# Patient Record
Sex: Female | Born: 1990 | Race: White | Hispanic: No | Marital: Single | Smoking: Never smoker
Health system: Southern US, Community
[De-identification: ages and names within clinical notes are randomized; demographics above are authoritative.]

## PROBLEM LIST (undated history)

## (undated) DIAGNOSIS — G253 Myoclonus: Secondary | ICD-10-CM

## (undated) DIAGNOSIS — G808 Other cerebral palsy: Secondary | ICD-10-CM

## (undated) DIAGNOSIS — R569 Unspecified convulsions: Secondary | ICD-10-CM

## (undated) HISTORY — DX: Other cerebral palsy: G80.8

## (undated) HISTORY — DX: Unspecified convulsions: R56.9

## (undated) HISTORY — DX: Myoclonus: G25.3

---
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
## 2012-01-27 ENCOUNTER — Emergency Department: Payer: Self-pay | Admitting: Emergency Medicine

## 2013-01-11 DIAGNOSIS — G253 Myoclonus: Secondary | ICD-10-CM | POA: Insufficient documentation

## 2013-01-11 DIAGNOSIS — G808 Other cerebral palsy: Secondary | ICD-10-CM | POA: Insufficient documentation

## 2013-02-05 ENCOUNTER — Ambulatory Visit (INDEPENDENT_AMBULATORY_CARE_PROVIDER_SITE_OTHER): Payer: 59 | Admitting: Pediatrics

## 2013-02-05 ENCOUNTER — Encounter: Payer: Self-pay | Admitting: Pediatrics

## 2013-02-05 VITALS — BP 106/76 | HR 72 | Ht 59.25 in | Wt 129.4 lb

## 2013-02-05 DIAGNOSIS — Z3041 Encounter for surveillance of contraceptive pills: Secondary | ICD-10-CM

## 2013-02-05 DIAGNOSIS — G808 Other cerebral palsy: Secondary | ICD-10-CM

## 2013-02-05 DIAGNOSIS — G253 Myoclonus: Secondary | ICD-10-CM

## 2013-02-05 MED ORDER — MINASTRIN 24 FE 1-20 MG-MCG(24) PO CHEW: CHEWABLE_TABLET | ORAL | Status: AC

## 2013-02-05 MED ORDER — KEPPRA XR 750 MG PO TB24: ORAL_TABLET | ORAL | Status: DC

## 2013-02-05 NOTE — Progress Notes (Signed)
Patient: Crystal Gonzales MRN: [id number] Sex: female DOB: 02/10/1990  Provider: Deetta PerlaHICKLING,Kimela Malstrom H, MD Location of Care: [facility]  Note type: Routine return visit  History of Present Illness: Referral Source: Dr. Johnsie Cancelhomas Schemerhorn History from: mother, patient and [facility] chart Chief Complaint: Seizures  Crystal Gonzales is a 23 y.o. female who returns for evaluation and management of congenital right hemiparesis, and photosensitive myoclonus.  The patient was seen on February 05, 2013.  Her last visit was on February 03, 2012.  She has a history of photosensitive myoclonus.  She had congenital left brain infarction, which caused right hemiparesis and hemiatrophy.  Her last known seizure was on August 12, 2011.  This was associated with failure to take her medicine the night before.  She has not experienced any further seizures or myoclonus.  She was able to obtain her driver's license.  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  She plans to graduate this spring with a degree in business administration and has decided to come back to [place] to seek employment.    She takes and tolerates Keppra XR without significant side effects.  She has a longstanding problem with insomnia and states that it may take her 2 to 3 hours to fall asleep, but she gives herself 10 hours to sleep.  She has a problem with obesity and easy fatigability.  Despite the fact that Keppra increases her appetite, she has only gained 3 pounds in the past year.  She has not had any significant underlying medical problems.  She had no other concerns today.  Review of Systems: 12 system review was remarkable for weight gain, headache, insomnia and sleepiness  Past Medical History  Diagnosis Date  . Myoclonus   . Congenital hemiplegia   . Seizures    Hospitalizations: no, Head Injury: no, Nervous System Infections: no, Immunizations up to date: yes Past Medical  History Comments: Hospitalized briefly for pneumonia at age 79.  The patient presented at 23 years of age with a 1 year history of shaking all over lasting for seconds without loss of consciousness when in the presence of a flickering light.  His most often occur when she was in the car, particularly in the fall and winter and spring.  This also occurred when she pictures taken of her with a flash.  EEG July 11, 2006 showed photo myoclonic responses at 9, 11, and 13 Hz.  She had blinking movements of her eyelids at 5 and 7 Hz.  At 15 Hz the patient jerked her head up and turned away.  This stopped as soon as the stimulus stopped.  She was placed on Keppra which was gradually escalated and worked quite well.    She is also experienced migraine headaches and for a time kept a headache calendar.  Though she is gained considerable weight over the past few years, she was on Keppra for 2 years with a weight gain of 1 pound.  It seems unlikely that Keppra is responsible for her problem with weight.We have discussed alternatives to this treatment, but this is the best treatment for her condition, and has been effective.  Birth History 1 lbs. 14 oz. Infant born at 3926 weeks gestational age to a 23 year old g 2 p 0 0 1 0 female. Gestation was complicated by vaginal bleeding throughout the pregnancy, incompetent cervix, possible placenta previa. Mother received Epidural anesthesia normal spontaneous vaginal delivery Nursery Course was complicated by assist  ventilation for 8 weeks, grade 4 in her parenchymal hemorrhage Growth and Development was recalled as  delayed with evidence of right hemiparesis.  Diagnosis of delay was not made until she was 39 months of age no signs or present earlier.  Behavior History none  Surgical History Past Surgical History  Procedure Laterality Date  . Other surgical history  1995    abdominal herniorrhaphy surgery    Family History family history includes Liver disease  in her paternal grandfather. Family History is negative migraines, seizures, cognitive impairment, blindness, deafness, birth defects, chromosomal disorder, autism.  Social History History   Social History  . Marital Status: Single    Spouse Name: N/A    Number of Children: N/A  . Years of Education: N/A   Social History Main Topics  . Smoking status: Passive Smoke Exposure - Never Smoker  . Smokeless tobacco: Never Used  . Alcohol Use: Yes     Comment: Patient drinks alcohol once a month.  . Drug Use: No  . Sexual Activity: Yes    Partners: Male    Birth Control/ Protection: Pill   Other Topics Concern  . None   Social History Narrative  . 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 School comments Rilynne is doing well in school.  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 No current outpatient prescriptions on file prior to visit.   No current facility-administered medications on file prior to visit.   The medication list was reviewed and reconciled. All changes or newly prescribed medications were explained.  A complete medication list was provided to the patient/caregiver.  Allergies  Allergen Reactions  . Other     Cherries    Physical Exam BP 106/76  Pulse 72  Ht 4' 11.25" (1.505 m)  Wt 129 lb 6.4 oz (58.695 kg)  BMI 25.91 kg/m2  General: alert, well developed, well nourished, in no acute distress, brown hair, brown eyes, right handed Head: normocephalic, no dysmorphic features Ears, Nose and Throat: Otoscopic: Tympanic membranes normal.  Pharynx: oropharynx is pink without exudates or tonsillar hypertrophy. Neck: supple, full range of motion, no cranial or cervical bruits Respiratory: auscultation  clear Cardiovascular: no murmurs, pulses are normal Musculoskeletal: no skeletal deformities or apparent scoliosis Skin: no rashes or neurocutaneous lesions  Neurologic Exam  Mental Status: alert; oriented to person, place and year; knowledge is normal for age; language is normal Cranial Nerves: visual fields are full to double simultaneous stimuli; extraocular movements are full and conjugate; pupils are around reactive to light; funduscopic examination shows sharp disc margins with normal vessels; symmetric facial strength; midline tongue and uvula; air conduction is greater than bone conduction bilaterally. Motor: Normal strength, tone and mass; good fine motor movements; no pronator drift. Sensory: intact responses to cold, vibration, proprioception and stereognosis Coordination: good finger-to-nose, rapid repetitive alternating movements and finger apposition Gait and Station: normal gait and station: patient is able to walk on heels, toes and tandem without difficulty; balance is adequate; Romberg exam is negative; Gower response is negative Reflexes: symmetric and diminished bilaterally; no clonus; bilateral flexor plantar responses.  Assessment 1. Congenital hemiplegia (343.1). 2. Myoclonus in good control (333.2).  Discussion The patient is doing well.  There is no reason to change her medication.  Plan 1.Refill of Keppra  750 mg XR one twice daily.   2.At her request, I will also fill her Minestrin until she can return to see her obstetrician in May 2015.    I will continue to follow her until she is gainfully employed.  At that time, we will see her one last time and then refer her to adult neurology for ongoing followup.  I see no reason why she cannot continue to take this medication.  It has a low incidence of teratogenic effects, although the number of women pregnant on the medication is relatively small.  I will plan to see her when she returns home from school.  I spent 30  minutes of face-to-face time with the patient and her mother, more than half of it in consultation.  Deetta Perla MD

## 2013-08-10 ENCOUNTER — Other Ambulatory Visit: Payer: Self-pay | Admitting: Pediatrics

## 2013-12-07 ENCOUNTER — Telehealth: Payer: Self-pay

## 2013-12-07 NOTE — Telephone Encounter (Signed)
Crystal Gonzales lvm stating that she needs a refill and that she would like to schedule her f/u visit for January 2016. I called pt back and lvm asking her to call me back with regards to where she would like the refill sent. I told her that I would be happy to schedule the f/u as well. I will await the call back.

## 2013-12-07 NOTE — Telephone Encounter (Signed)
I called and left another vm asking her to call me with the below information.

## 2013-12-10 NOTE — Telephone Encounter (Signed)
I lvm for pt asking her to call me with the pharmacy information and to schedule a f/u visit as noted below.

## 2013-12-12 MED ORDER — KEPPRA XR 750 MG PO TB24: ORAL_TABLET | ORAL | Status: DC

## 2013-12-12 NOTE — Telephone Encounter (Signed)
Irving Burtonmily called and would like the Rx sent to [facility] and I scheduled f/u for 02/06/14.

## 2013-12-12 NOTE — Telephone Encounter (Signed)
I have faxed the Rx as requested.

## 2014-02-06 ENCOUNTER — Ambulatory Visit (INDEPENDENT_AMBULATORY_CARE_PROVIDER_SITE_OTHER): Payer: No Typology Code available for payment source | Admitting: Pediatrics

## 2014-02-06 ENCOUNTER — Encounter: Payer: Self-pay | Admitting: Pediatrics

## 2014-02-06 VITALS — BP 110/78 | HR 110 | Ht 59.25 in | Wt 141.6 lb

## 2014-02-06 DIAGNOSIS — G43009 Migraine without aura, not intractable, without status migrainosus: Secondary | ICD-10-CM | POA: Insufficient documentation

## 2014-02-06 DIAGNOSIS — G253 Myoclonus: Secondary | ICD-10-CM

## 2014-02-06 DIAGNOSIS — G808 Other cerebral palsy: Secondary | ICD-10-CM

## 2014-02-06 MED ORDER — KEPPRA XR 750 MG PO TB24: ORAL_TABLET | ORAL | Status: DC

## 2014-02-06 NOTE — Patient Instructions (Signed)
There are 3 lifestyle behaviors that are important to minimize headaches.  You should sleep 8 hours at night time.  Bedtime should be a set time for going to bed and waking up with few exceptions.  You need to drink about 48 ounces of water per day, more on days when you are out in the heat.  This works out to 3 16 ounce water bottles per day.  You may need to flavor the water so that you will be more likely to drink it.  Do not use Kool-Aid or other sugar drinks because they add empty calories and actually increase urine output.  You need to eat 3 meals per day.  You should not skip meals.  The meal does not have to be a big one.  Make daily entries into the headache calendar and sent it to me at the end of each calendar month.  I will call you or your parents and we will discuss the results of the headache calendar and make a decision about changing treatment if indicated.  You should receive 400 mg of Ibuprofen or 625 mg of acetaminophen at the onset of headaches that are severe enough to cause obvious pain and other symptoms.

## 2014-02-06 NOTE — Progress Notes (Signed)
Patient: Crystal Gonzales MRN: [id number] Sex: female DOB: [birth year]  Provider: Deetta Perla, MD Location of Care: [facility]  Note type: Routine return visit  History of Present Illness: Referral Source: Dr. Johnsie Cancel History from: patient and [facility] chart Chief Complaint: Seizures  Crystal Gonzales is a 24 y.o. female who was evaluated on February 06, 2014 for the first time since February 05, 2013.  She has congenital right hemiparesis and photosensitive myoclonus.  She had a congenital left brain infarction causing right hemiparesis, spasticity, and hemiatrophy.  Her last known seizure was on August 12, 2011, which was associated with failure to take medication the night before.  She takes and tolerates Keppra XR without significant side effects.  She has a longstanding problem with insomnia and knows that she has to give herself a longer period of time to sleep so that she is properly rested.  She has had a problem with obesity and unfortunately gained 12.6 pounds into the past year after controlling her weight in the previous year.  She has occasional migraine headaches which have worsened.  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  She works 12 hours shifts for two weeks on days with a three-day break and then two weeks on nights.  She has headaches almost every day that begin at 3 p.m. whether or not she is at work.  They involve the right frontal region and associated with pounding pain, nausea, and occasional vomiting.  Light irritates her right eye and movement causes increase pounding.  Headaches can last until the next day.  She takes over-the-counter acetaminophen with caffeine which provides some benefit.  She relates the increased frequency of her headaches to the stress in her job which is understandable.  She has  very good benefits and intends to keep this job.  She receives 90-day supplies of Keppra XR for her seizures through [facility].  She has her own insurance from her job and also saw her parents insurance which acts as a Ambulance person.  In the past year, she had problems with plantar fasciitis which fortunately has lessened considerably with the use of physical therapy.  Review of Systems: 12 system review was remarkable for headaches and nausea  Past Medical History Diagnosis Date  . Myoclonus   . Congenital hemiplegia   . Seizures    Hospitalizations: No., Head Injury: No., Nervous System Infections: No., Immunizations up to date: Yes.    Hospitalized briefly for pneumonia at age 81.  The patient presented at 34 years of age with a 1 year history of shaking all over lasting for seconds without loss of consciousness when in the presence of a flickering light. His most often occur when she was in the car, particularly in the fall and winter and spring. This also occurred when she pictures taken of her with a flash.  EEG July 11, 2006 showed photo myoclonic responses at 9, 11, and 13 Hz. She had blinking movements of her eyelids at 5 and 7 Hz. At 15 Hz the patient jerked her head up and turned away. This stopped as soon as the stimulus stopped.  She was placed on Keppra which was gradually escalated and worked quite well.   She is also experienced migraine headaches and for a time kept a headache calendar.  Though she has gained considerable weight over  the past few years, she was on Keppra for 2 years with a weight gain of 1 pound. It seems unlikely that Keppra is responsible for her problem with weight.We have discussed alternatives to this treatment, but this is the best treatment for her condition, and has been effective.  Birth History 1 lbs. 14 oz. Infant born at 426 weeks gestational age to a 24 year old g 2 p 0 0 1 0 female. Gestation was complicated by vaginal bleeding throughout the  pregnancy, incompetent cervix, possible placenta previa. Mother received Epidural anesthesia normal spontaneous vaginal delivery Nursery Course was complicated by assist ventilation for 8 weeks, grade 4 in her parenchymal hemorrhage Growth and Development was recalled as delayed with evidence of right hemiparesis. Diagnosis of delay was not made until she was 9318 months of age no signs or present earlier.  Behavior History none  Surgical History Procedure Laterality Date  . Other surgical history  1995    abdominal herniorrhaphy surgery   Family History family history includes Liver disease in her paternal grandfather. Family history is negative for migraines, seizures, intellectual disabilities, blindness, deafness, birth defects, chromosomal disorder, or autism.  Social History . Marital Status: Single    Spouse Name: N/A    Number of Children: N/A  . Years of Education: N/A   Social History Main Topics  . Smoking status: Never Smoker   . Smokeless tobacco: Never Used  . Alcohol Use: 0.0 oz/week    0 Not specified per week     Comment: Patient drinks alcohol once a month.  . Drug Use: No  . 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   Allergies Allergen Reactions  . Other     Cherries   Physical Exam BP 110/78 mmHg  Pulse 110  Ht 4' 11.25" (1.505 m)  Wt 141 lb 9.6 oz (64.229 kg)  BMI 28.36 kg/m2  LMP 01/14/2014 (Approximate)  General: alert, well developed, well nourished, in no acute distress, brown hair, brown eyes, right handed Head: normocephalic, no dysmorphic features Ears, Nose and Throat: Otoscopic: Tympanic membranes normal. Pharynx: oropharynx is pink without exudates  or tonsillar hypertrophy. Neck: supple, full range of motion, no cranial or cervical bruits Respiratory: auscultation clear Cardiovascular: no murmurs, pulses are normal Musculoskeletal: no skeletal deformities or apparent scoliosis Skin: no rashes or neurocutaneous lesions  Neurologic Exam  Mental Status: alert; oriented to person, place and year; knowledge is normal for age; language is normal Cranial Nerves: visual fields are full to double simultaneous stimuli; extraocular movements are full and conjugate; pupils are around reactive to light; funduscopic examination shows sharp disc margins with normal vessels; symmetric facial strength; midline tongue and uvula; air conduction is greater than bone conduction bilaterally. Motor: Normal strength, tone and mass; good fine motor movements; no pronator drift. Sensory: intact responses to cold, vibration, proprioception and stereognosis Coordination: good finger-to-nose, rapid repetitive alternating movements and finger apposition Gait and Station: normal gait and station: patient is able to walk on heels, toes and tandem without difficulty; balance is adequate; Romberg exam is negative; Gower response is negative Reflexes: symmetric and diminished bilaterally; no clonus; bilateral flexor plantar responses.  Assessment 1. Congenital hemiplegia, G80.8. 2. Myoclonus, G25.3. 3. Migraine without aura and without status  migrainosus, not intractable, G43.009.  Discussion Myoclonic seizures have been brought under complete control with Keppra XR.  There is no reason to change it.  She has frequent migraines that need to be documented.  It is not a reasonable plan to recommend that she change her job.  I made number of recommendations to her including sleep habits, hydration, and eating small frequent meals.  The frequency of her headaches makes the use of Triptan medicines unfeasible.  Her BMI is 28.36 which is up slightly from 25.91 on her last  visit.  In part I suspect the plantar fasciitis made it impossible for her to physically exercise.  Her hemiparesis also makes it difficult to exercise.  Riding a bike, or swimming would be much more feasible and provide less strain on her weak right arm and leg.  Plan Ala will keep a daily prospective headache calendar and send it to me at the end of each calendar month.  I will contact her by phone and make recommendations concerning preventative or abortive medicine to treat her migraines.  She will continue to take Keppra XR without change.  She will return in three months for routine evaluation pertaining to her headaches.  I spent 30 minutes of face-to-face time with Yoshie, more than half of it in consultation.   Medication List   This list is accurate as of: 02/06/14 12:20 PM.  Always use your most recent med list.       KEPPRA XR 750 MG Tb24  Generic drug:  Levetiracetam  Take 1 tablet by mouth  twice a day     loratadine 10 MG tablet  Commonly known as:  CLARITIN  Take 10 mg by mouth.     MINASTRIN 24 FE 1-20 MG-MCG(24) Chew  Generic drug:  Norethin Ace-Eth Estrad-FE  Take one tablet by mouth daily as directed     MINASTRIN 24 FE 1-20 MG-MCG(24) Chew  Generic drug:  Norethin Ace-Eth Estrad-FE  Chew by mouth.     SPRINTEC 28 0.25-35 MG-MCG tablet  Generic drug:  norgestimate-ethinyl estradiol  Take by mouth.     traZODone 50 MG tablet  Commonly known as:  DESYREL  1 tab by mouth as needed for sleep.      The medication list was reviewed and reconciled. All changes or newly prescribed medications were explained.  A complete medication list was provided to the patient/caregiver.  Deetta Perla MD

## 2014-02-25 ENCOUNTER — Telehealth: Payer: Self-pay | Admitting: Pediatrics

## 2014-02-25 DIAGNOSIS — G43009 Migraine without aura, not intractable, without status migrainosus: Secondary | ICD-10-CM

## 2014-02-25 NOTE — Telephone Encounter (Signed)
Headache calendar from January 2015 on [name]. 19 days were recorded.  2 days were headache free.  8 days were associated with tension type headaches, 5 required treatment.  There were 9 days of migraines, 2 were severe.  I called and left a message for mother to call back tomorrow.

## 2014-02-26 MED ORDER — TOPIRAMATE 25 MG PO TABS: ORAL_TABLET | ORAL | Status: DC

## 2014-02-26 NOTE — Telephone Encounter (Signed)
6-1/2 minute discussion with the patient.  I gave her informed consent concerning propranolol and topiramate.  She has asthma and so propranolol is not a good idea.  She has a problem with obesity so low that Depakote would also not be a good idea.  We'll send a 90 day prescription to her mail order pharmacy.  Marcelino DusterMichelle please send this tomorrow.

## 2014-02-27 NOTE — Telephone Encounter (Signed)
Faxed to mail order as requested: [phone].

## 2014-03-05 ENCOUNTER — Telehealth: Payer: Self-pay | Admitting: Pediatrics

## 2014-03-05 NOTE — Telephone Encounter (Signed)
Headache calendar from January 2016 on [place]. 31 days were recorded.  3 days were headache free.  11 days were associated with tension type headaches, 7 required treatment.  There were 12 days of migraines, 2 were severe.  Propranolol was ordered on January 26.  I think it's too soon to know if it's going to work or if it's even been started.

## 2014-03-07 NOTE — Telephone Encounter (Signed)
Irving Burtonmily left a message that she was returning Dr Hovnanian EnterprisesHickling's call. She can be reached today at [phone]. TG

## 2014-03-07 NOTE — Telephone Encounter (Signed)
I left a message for the patient to call back 

## 2014-03-13 NOTE — Telephone Encounter (Signed)
I left a message for the patient to call back 

## 2014-03-14 NOTE — Telephone Encounter (Signed)
I left a message to call back.  I told her that I would not call back unless she initiated the call.

## 2014-03-21 NOTE — Telephone Encounter (Signed)
Rosezetta lvm stating that she will be available to talk on Monday and Tuesday. She can be reached at [phone].

## 2014-03-23 ENCOUNTER — Emergency Department: Payer: Self-pay | Admitting: Emergency Medicine

## 2014-03-25 NOTE — Telephone Encounter (Signed)
10 minute phone call with patient.  She has a 3 day migraine region went to the emergency room and had a CT scan of the brain which was unremarkable migraine cocktail did not help her.  I don't know what was in it.  She was then was given morphine which lessened her headache but the headache came back as soon as it wore off.  Her headache is not as severe as it was.  I recommended trying to increase to 50 mg topiramate.  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  Despite her headache, she's gone back to work.

## 2014-03-25 NOTE — Telephone Encounter (Addendum)
Crystal Gonzales lvm stating that she was recently taken to [facility] ED via ambulance from work for a severe migraine. She asked that Dr. Rexene EdisonH give her a call around 2 pm today at: [phone].

## 2014-04-01 ENCOUNTER — Telehealth: Payer: Self-pay | Admitting: Family

## 2014-04-01 NOTE — Telephone Encounter (Signed)
Eunice Blasemily Steiksal left a message saying that she wants Dr Sharene SkeansHickling to call her back. Irving Burtonmily says that she is at work and said that she has a nosebleed, a headache and is lightheaded. Irving Burtonmily wants direction on what to do. She said that if she missed the call to please leave a message. Carmita's phone number is [phone]. TG

## 2014-04-01 NOTE — Telephone Encounter (Signed)
I left a message for Irving Burtonmily to call back.

## 2014-04-02 NOTE — Telephone Encounter (Signed)
I reviewed Crystal Gonzales's headache calendar and left a message for her to call back.  Headache calendar from February 2016 on [place]. 29 days were recorded.  7 days were headache free.  11 days were associated with tension type headaches, 7 required treatment.  There were 11 days of migraines, 7 were severe.  She is taking grams topiramate beginning February 17 and had 8 migraines and four tension headaches.  There were no days that were headache free.

## 2014-04-03 NOTE — Telephone Encounter (Signed)
Patient went to an urgent care and was given Vicodin.  The urgent care physician recommended a headache clinic in [place].  I have no problem with Crystal Gonzales seeking care through an adult neurologist.  If she obtains satisfaction, then there'll be a full transfer of care.  If not, we will be happy to see her again.  She was taken off to care may because it made her dizzy.  We can consider other preventative medications while she waits for her second opinion.

## 2014-04-03 NOTE — Telephone Encounter (Signed)
Crystal Gonzales left a message in the general voicemail saying that she was off work Weds 04/03/14 and Thurs 03/003/16. She said that she plans to go to [facility]gent [facility] [place] because of how sick she has been with headache. Please call her at [phone]. TG

## 2014-04-29 ENCOUNTER — Emergency Department: Payer: Self-pay | Admitting: Emergency Medicine

## 2014-04-29 ENCOUNTER — Ambulatory Visit: Payer: Self-pay | Admitting: Physician Assistant

## 2014-04-29 LAB — BASIC METABOLIC PANEL
Anion Gap: 7 (ref 7–16)
BUN: 6 mg/dL
CO2: 23 mmol/L
CREATININE: 0.55 mg/dL
Calcium, Total: 8.8 mg/dL — ABNORMAL LOW
Chloride: 107 mmol/L
EGFR (African American): >60
GLUCOSE: 91 mg/dL
POTASSIUM: 4.1 mmol/L
SODIUM: 137 mmol/L

## 2014-04-29 LAB — CBC
HCT: 45.4 % (ref 35.0–47.0)
HGB: 14.8 g/dL (ref 12.0–16.0)
MCH: 28.4 pg (ref 26.0–34.0)
MCHC: 32.6 g/dL (ref 32.0–36.0)
MCV: 87 fL (ref 80–100)
Platelet: 245 10*3/uL (ref 150–440)
RBC: 5.2 10*6/uL (ref 3.80–5.20)
RDW: 12.6 % (ref 11.5–14.5)
WBC: 15.8 10*3/uL — ABNORMAL HIGH (ref 3.6–11.0)

## 2014-04-29 LAB — D-DIMER(ARMC): D-Dimer: 402 ng/ml

## 2014-04-29 LAB — TROPONIN I: Troponin-I: <0.03 ng/mL

## 2014-05-10 ENCOUNTER — Telehealth: Payer: Self-pay | Admitting: Pediatrics

## 2014-05-10 DIAGNOSIS — G43009 Migraine without aura, not intractable, without status migrainosus: Secondary | ICD-10-CM

## 2014-05-10 NOTE — Telephone Encounter (Signed)
Headache calendar from March 2016 on [place]. 31 days were recorded.  no days were headache free.  17 days were associated with tension type headaches, 11 required treatment.  There were 14 days of migraines, 7 were severe.

## 2014-05-13 NOTE — Telephone Encounter (Signed)
I left a message for the patient call back.

## 2014-05-14 NOTE — Telephone Encounter (Signed)
Orson GearEmily Mira left a message for Dr Sharene SkeansHickling saying she received a message from her Mom that you contacted her. So she is calling back about that. She also wants to know if the [facility] ([facility]). She is off work Weds if you could call her back then at [phone]. TG

## 2014-05-15 ENCOUNTER — Encounter: Payer: Self-pay | Admitting: Pediatrics

## 2014-05-15 MED ORDER — SUMATRIPTAN SUCCINATE 25 MG PO TABS: ORAL_TABLET | ORAL | Status: DC

## 2014-05-15 NOTE — Telephone Encounter (Signed)
Crystal Gonzales is seeking [facility] from [facility].  She needed a letter stating that she was medically able to carry out her job that I have dictated.  I will send a prescription for sumatriptan to her mail order pharmacy.

## 2014-05-15 NOTE — Telephone Encounter (Signed)
I left a message for the patient to call back between 1:30 and 2.  I told her that I would not be able to speak to her after that until 4:30 to 5.

## 2014-06-15 ENCOUNTER — Telehealth: Payer: Self-pay | Admitting: Pediatrics

## 2014-06-15 NOTE — Telephone Encounter (Signed)
Head[place] calendar from April 2016 on [place]. 30 days were recorded.  5 days were headache free.  15 days were associated with tension type headaches, 9 required treatment.  There were 10 days of migraines, 2 were severe.

## 2014-06-18 NOTE — Telephone Encounter (Signed)
I left a message for the patient to call. 

## 2014-06-19 NOTE — Telephone Encounter (Signed)
I left a message for the patient to call. 

## 2014-09-03 NOTE — Telephone Encounter (Signed)
I left a message and encouraged the patient to call back.

## 2014-09-24 NOTE — Telephone Encounter (Signed)
There has been no phone call back.  I'm going to close this note.

## 2015-03-10 ENCOUNTER — Other Ambulatory Visit: Payer: Self-pay | Admitting: Pediatrics

## 2015-03-10 ENCOUNTER — Telehealth: Payer: Self-pay | Admitting: Pediatrics

## 2015-03-10 NOTE — Telephone Encounter (Signed)
We have not seen Crystal Gonzales in a year.  There is been no contact since May.  She needs to make an appointment to be seen.  This is been requested through the pharmacy.

## 2015-08-10 ENCOUNTER — Other Ambulatory Visit: Payer: Self-pay | Admitting: Pediatrics

## 2015-09-08 ENCOUNTER — Telehealth: Payer: Self-pay

## 2015-09-08 NOTE — Telephone Encounter (Signed)
Patient called stating that she has an appointment Friday, September 12, 2015 @ 2:45, which she does. She also stated that she wanted to know if her medicine was still available for her to get after her appointment on Friday. She is requesting a call back.   [phone]

## 2015-09-08 NOTE — Telephone Encounter (Signed)
Crystal Gonzales called back. She said that when she scheduled her follow up appointment with Dr Sharene SkeansHickling that she was told that she had medication here to pick up. I told Crystal Gonzales that there must have been a miscommunication because we do not have medication here for her. I instructed her to contact her pharmacy to request a refill if she needs one. Crystal Gonzales had no further questions. TG

## 2015-09-08 NOTE — Telephone Encounter (Signed)
I left a message for Irving Burtonmily and asked her to call me back. TG

## 2015-09-10 ENCOUNTER — Other Ambulatory Visit: Payer: Self-pay | Admitting: Pediatrics

## 2015-09-12 ENCOUNTER — Encounter: Payer: Self-pay | Admitting: Pediatrics

## 2015-09-12 ENCOUNTER — Ambulatory Visit (INDEPENDENT_AMBULATORY_CARE_PROVIDER_SITE_OTHER): Payer: 59 | Admitting: Pediatrics

## 2015-09-12 VITALS — BP 110/68 | HR 92 | Ht 58.75 in | Wt 142.8 lb

## 2015-09-12 DIAGNOSIS — G43009 Migraine without aura, not intractable, without status migrainosus: Secondary | ICD-10-CM | POA: Diagnosis not present

## 2015-09-12 DIAGNOSIS — G808 Other cerebral palsy: Secondary | ICD-10-CM

## 2015-09-12 DIAGNOSIS — G253 Myoclonus: Secondary | ICD-10-CM

## 2015-09-12 NOTE — Progress Notes (Signed)
Patient: Crystal Gonzales MRN: [id number] Sex: female DOB: [birth year]  Provider: Deetta Perla, MD Location of Care: [facility]  Note type: Routine return visit  History of Present Illness: Referral Source: Crystal Running, MD History from: patient and [facility] chart Chief Complaint: Seizures  Crystal Gonzales is a 25 y.o. female who returns on August 12, 2015, for the first time since February 06, 2014.  She has congenital right hemiparesis and photosensitive myoclonus.  She had a congenital left brain infarction.  Her last known seizure was August 12, 2011 associated with failure to take medication the night before.  She takes and tolerates Keppra XR without significant side effects.  Her weight is stabilized since I last saw her she has gone 18 months and has gained less than a pound.  She had headaches almost every day.  I think that these were related to her work at [employer].  She quit the job and her headaches got better.  She also went to [facility].  I cannot find if I encouraged her to do so, but I am very pleased that she did.  She now receives medications to abort her headaches with diclofenac and Relpax.  She is off atenolol.  She says that she has a restless sleeper at this time, but she is trying to stay in a stable sleep and wake cycle.  I recommended her for that.  She has moved back with her mother because she has not been able to find gainful employment.  She is on her father's insurance for years.  Hopefully, she will be successful over the next year in returning to work force.  Review of Systems: 12 system review was assessed and was negative  Past Medical History Diagnosis Date  . Congenital hemiplegia (HCC)   . Myoclonus   . Seizures (HCC)    Hospitalizations: No., Head Injury: No., Nervous System Infections: No., Immunizations up to date: Yes.    Hospitalized briefly for pneumonia at age 87.  The patient presented at 76 years of age  with a 1 year history of shaking all over lasting for seconds without loss of consciousness when in the presence of a flickering light. His most often occur when she was in the car, particularly in the fall and winter and spring. This also occurred when she pictures taken of her with a flash.  EEG July 11, 2006 showed photo myoclonic responses at 9, 11, and 13 Hz. She had blinking movements of her eyelids at 5 and 7 Hz. At 15 Hz the patient jerked her head up and turned away. This stopped as soon as the stimulus stopped.  She was placed on Keppra which was gradually escalated and worked quite well.   She is also experienced migraine headaches and for a time kept a headache calendar.  Though she has gained considerable weight over the past few years, she was on Keppra for 2 years with a weight gain of 1 pound. It seems unlikely that Keppra is responsible for her problem with weight.We have discussed alternatives to this treatment, but this is the best treatment for her condition, and has been effective.  Birth History 1 lbs. 14 oz. Infant born at [redacted] weeks gestational age to a 25 year old g 2 p 0 0 1 0 female. Gestation was complicated by vaginal bleeding throughout the pregnancy, incompetent cervix, possible placenta previa. Mother received Epidural anesthesia normal spontaneous vaginal delivery Nursery Course was complicated by assist ventilation for 8 weeks,  grade 4 in her parenchymal hemorrhage Growth and Development was recalled as delayed with evidence of right hemiparesis. Diagnosis of delay was not made until she was 6718 months of age no signs or present earlier.  Behavior History none  Surgical History Procedure Laterality Date  . OTHER SURGICAL HISTORY  1995   abdominal herniorrhaphy surgery   Family History family history includes Liver disease in her paternal grandfather. Family history is negative for migraines, seizures, intellectual disabilities, blindness,  deafness, birth defects, chromosomal disorder, or autism.  Social History . Marital status: Single    Spouse name: N/A  . Number of children: N/A  . Years of education: N/A   Social History Main Topics  . Smoking status: Never Smoker  . Smokeless tobacco: Never Used  . Alcohol use 0.0 oz/week     Comment: Patient drinks alcohol once a month.  . Drug use: No  . 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 She lives with her mother.   Allergies Allergen Reactions  . Other Hives    Cherries   Physical Exam BP 110/68   Pulse 92   Ht 4' 10.75" (1.492 m)   Wt 142 lb 12.8 oz (64.8 kg)   BMI 29.09 kg/m   General: alert, well developed, obese, short stature, in no acute distress, brown hair, brown eyes, right handed Head: normocephalic, no dysmorphic features Ears, Nose and Throat: Otoscopic: tympanic membranes normal; pharynx: oropharynx is pink without exudates or tonsillar hypertrophy Neck: supple, full range of motion, no cranial or cervical bruits Respiratory: auscultation clear Cardiovascular: no murmurs, pulses are normal Musculoskeletal: no apparent scoliosis; Significant leg length discrepancy with the right being smaller than the left, the left hand is larger than the right however there does not appear to be significant discrepancy in the length of the arms Skin: no rashes or neurocutaneous lesions  Neurologic Exam  Mental Status: alert; oriented to person, place and year; knowledge is normal for age; language is normal; mild dysarthria, intelligible Cranial Nerves: visual fields are full to double simultaneous stimuli; extraocular movements are full and conjugate; pupils are round reactive to light; funduscopic examination shows sharp disc margins with normal vessels; symmetric facial  strength; midline tongue and uvula; air conduction is greater than bone conduction bilaterally Motor: Normal strength, tone and mass; good fine motor movements; no pronator drift Sensory: intact responses to cold, vibration, proprioception and stereognosis Coordination: good finger-to-nose, rapid repetitive alternating movements and finger apposition Gait and Station: Right hemiparetic gait and station with right hand fisted, arm extended, slight circumduction of the right leg without dragging it; patient is able to walk on toes and tandem with difficulty; balance is better on the left than the right; Romberg exam is negative Reflexes: symmetric and diminished bilaterally; no clonus; bilateral flexor plantar responses  Assessment 1. Myoclonus, G25.3. 2. Congenital hemiplegia, G80.8. 3. Migraine without aura without status migrainosus, not intractable, G43.009.  Discussion I am pleased that Crystal Gonzales is doing so well with her headaches.    Plan She wants to make a transition to an adult neurologist, [facility].  I asked her to make the appointment and I will send records when I can find a fax number.  I spent 30 minutes of face-to-face time with Crystal BurtonEmily.   Medication List   Accurate  as of 09/12/15 11:59 PM.      cetirizine 10 MG tablet Commonly known as:  ZYRTEC Take 10 mg by mouth 2 (two) times daily as needed.   diclofenac 50 MG EC tablet Commonly known as:  VOLTAREN Take 50 mg by mouth daily as needed.   eletriptan 40 MG tablet Commonly known as:  RELPAX Take 40 mg by mouth daily as needed.   KEPPRA XR 750 MG Tb24 Generic drug:  Levetiracetam Take 1 tablet by mouth  twice a day   MINASTRIN 24 FE 1-20 MG-MCG(24) Chew Generic drug:  Norethin Ace-Eth Estrad-FE Take one tablet by mouth daily as directed   ranitidine 150 MG tablet Commonly known as:  ZANTAC Take 150 mg by mouth daily.     The medication list was reviewed and reconciled. All changes or newly  prescribed medications were explained.  A complete medication list was provided to the patient/caregiver.  Deetta Perla MD

## 2015-11-03 ENCOUNTER — Other Ambulatory Visit: Payer: Self-pay | Admitting: Family

## 2016-03-03 ENCOUNTER — Other Ambulatory Visit: Payer: Self-pay | Admitting: Family

## 2018-08-15 ENCOUNTER — Emergency Department: Payer: BC Managed Care – PPO

## 2018-08-15 ENCOUNTER — Other Ambulatory Visit: Payer: Self-pay

## 2018-08-15 ENCOUNTER — Emergency Department
Admission: EM | Admit: 2018-08-15 | Discharge: 2018-08-15 | Disposition: A | Discharge status: Home / Self Care | Payer: BC Managed Care – PPO | Attending: Emergency Medicine | Admitting: Emergency Medicine

## 2018-08-15 ENCOUNTER — Encounter: Payer: Self-pay | Admitting: Emergency Medicine

## 2018-08-15 DIAGNOSIS — M549 Dorsalgia, unspecified: Secondary | ICD-10-CM | POA: Diagnosis not present

## 2018-08-15 DIAGNOSIS — Y939 Activity, unspecified: Secondary | ICD-10-CM | POA: Diagnosis not present

## 2018-08-15 DIAGNOSIS — S61512A Laceration without foreign body of left wrist, initial encounter: Secondary | ICD-10-CM | POA: Diagnosis not present

## 2018-08-15 DIAGNOSIS — Y92410 Unspecified street and highway as the place of occurrence of the external cause: Secondary | ICD-10-CM | POA: Insufficient documentation

## 2018-08-15 DIAGNOSIS — Y999 Unspecified external cause status: Secondary | ICD-10-CM | POA: Diagnosis not present

## 2018-08-15 DIAGNOSIS — M791 Myalgia, unspecified site: Secondary | ICD-10-CM | POA: Insufficient documentation

## 2018-08-15 DIAGNOSIS — R52 Pain, unspecified: Secondary | ICD-10-CM

## 2018-08-15 DIAGNOSIS — S161XXA Strain of muscle, fascia and tendon at neck level, initial encounter: Secondary | ICD-10-CM | POA: Diagnosis not present

## 2018-08-15 DIAGNOSIS — Z79899 Other long term (current) drug therapy: Secondary | ICD-10-CM | POA: Diagnosis not present

## 2018-08-15 DIAGNOSIS — M7918 Myalgia, other site: Secondary | ICD-10-CM

## 2018-08-15 DIAGNOSIS — S199XXA Unspecified injury of neck, initial encounter: Secondary | ICD-10-CM | POA: Diagnosis present

## 2018-08-15 LAB — BASIC METABOLIC PANEL
Anion gap: 10 (ref 5–15)
BUN: 13 mg/dL (ref 6–20)
CO2: 18 mmol/L — ABNORMAL LOW (ref 22–32)
Calcium: 8.6 mg/dL — ABNORMAL LOW (ref 8.9–10.3)
Chloride: 109 mmol/L (ref 98–111)
Creatinine, Ser: 0.68 mg/dL (ref 0.44–1.00)
GFR calc Af Amer: >60 mL/min (ref >60)
GFR calc non Af Amer: >60 mL/min (ref >60)
Glucose, Bld: 96 mg/dL (ref 70–99)
Potassium: 4 mmol/L (ref 3.5–5.1)
Sodium: 137 mmol/L (ref 135–145)

## 2018-08-15 LAB — CBC WITH DIFFERENTIAL/PLATELET
Abs Immature Granulocytes: 0.02 10*3/uL (ref 0.00–0.07)
Basophils Absolute: 0.1 10*3/uL (ref 0.0–0.1)
Basophils Relative: 1 %
Eosinophils Absolute: 0.3 10*3/uL (ref 0.0–0.5)
Eosinophils Relative: 4 %
HCT: 47.1 % — ABNORMAL HIGH (ref 36.0–46.0)
Hemoglobin: 15.6 g/dL — ABNORMAL HIGH (ref 12.0–15.0)
Immature Granulocytes: 0 %
Lymphocytes Relative: 29 %
Lymphs Abs: 2.4 10*3/uL (ref 0.7–4.0)
MCH: 29.5 pg (ref 26.0–34.0)
MCHC: 33.1 g/dL (ref 30.0–36.0)
MCV: 89 fL (ref 80.0–100.0)
Monocytes Absolute: 0.8 10*3/uL (ref 0.1–1.0)
Monocytes Relative: 10 %
Neutro Abs: 4.5 10*3/uL (ref 1.7–7.7)
Neutrophils Relative %: 56 %
Platelets: 317 10*3/uL (ref 150–400)
RBC: 5.29 MIL/uL — ABNORMAL HIGH (ref 3.87–5.11)
RDW: 12.3 % (ref 11.5–15.5)
WBC: 8.1 10*3/uL (ref 4.0–10.5)
nRBC: 0 % (ref 0.0–0.2)

## 2018-08-15 LAB — HCG, QUANTITATIVE, PREGNANCY: hCG, Beta Chain, Quant, S: 1 m[IU]/mL (ref <5)

## 2018-08-15 MED ORDER — ACETAMINOPHEN 500 MG PO TABS
1000.0000 mg | ORAL_TABLET | Freq: Once | ORAL | Status: AC
  Administered 2018-08-15: 08:00:00 1000 mg via ORAL
  Filled 2018-08-15: qty 2

## 2018-08-15 MED ORDER — NAPROXEN 500 MG PO TABS: 500.0000 mg | ORAL_TABLET | Freq: Two times a day (BID) | ORAL | 0 refills | Status: AC

## 2018-08-15 MED ORDER — FENTANYL CITRATE (PF) 100 MCG/2ML IJ SOLN
25.0000 ug | Freq: Once | INTRAMUSCULAR | Status: AC | PRN
  Administered 2018-08-15: 25 ug via INTRAVENOUS
  Filled 2018-08-15: qty 2

## 2018-08-15 MED ORDER — SODIUM CHLORIDE 0.9 % IV BOLUS
1000.0000 mL | Freq: Once | INTRAVENOUS | Status: AC
  Administered 2018-08-15: 08:00:00 1000 mL via INTRAVENOUS

## 2018-08-15 MED ORDER — FENTANYL CITRATE (PF) 100 MCG/2ML IJ SOLN
50.0000 ug | Freq: Once | INTRAMUSCULAR | Status: DC | PRN
  Filled 2018-08-15: qty 2
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
## 2018-08-15 NOTE — ED Notes (Signed)
Pt waiting on ride in lobby.  
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
## 2018-08-15 NOTE — ED Notes (Signed)
Pt ambulatory with MD and NT at bedside
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
